# Patient Record
Sex: Male | Born: 1940 | Race: White | Hispanic: No | Marital: Married | Smoking: Never smoker
Health system: Southern US, Community
[De-identification: ages and names within clinical notes are randomized; demographics above are authoritative.]

## PROBLEM LIST (undated history)

## (undated) DIAGNOSIS — T8859XA Other complications of anesthesia, initial encounter: Secondary | ICD-10-CM

## (undated) DIAGNOSIS — E222 Syndrome of inappropriate secretion of antidiuretic hormone: Secondary | ICD-10-CM

## (undated) DIAGNOSIS — R1310 Dysphagia, unspecified: Secondary | ICD-10-CM

## (undated) DIAGNOSIS — K5730 Diverticulosis of large intestine without perforation or abscess without bleeding: Secondary | ICD-10-CM

## (undated) DIAGNOSIS — E039 Hypothyroidism, unspecified: Secondary | ICD-10-CM

## (undated) DIAGNOSIS — R269 Unspecified abnormalities of gait and mobility: Secondary | ICD-10-CM

## (undated) DIAGNOSIS — E079 Disorder of thyroid, unspecified: Secondary | ICD-10-CM

## (undated) DIAGNOSIS — K219 Gastro-esophageal reflux disease without esophagitis: Secondary | ICD-10-CM

## (undated) DIAGNOSIS — M1990 Unspecified osteoarthritis, unspecified site: Secondary | ICD-10-CM

## (undated) DIAGNOSIS — E669 Obesity, unspecified: Secondary | ICD-10-CM

## (undated) DIAGNOSIS — T4145XA Adverse effect of unspecified anesthetic, initial encounter: Secondary | ICD-10-CM

## (undated) DIAGNOSIS — I10 Essential (primary) hypertension: Secondary | ICD-10-CM

## (undated) DIAGNOSIS — M5416 Radiculopathy, lumbar region: Secondary | ICD-10-CM

## (undated) DIAGNOSIS — G629 Polyneuropathy, unspecified: Secondary | ICD-10-CM

## (undated) HISTORY — DX: Adverse effect of unspecified anesthetic, initial encounter: T41.45XA

## (undated) HISTORY — DX: Diverticulosis of large intestine without perforation or abscess without bleeding: K57.30

## (undated) HISTORY — DX: Dysphagia, unspecified: R13.10

## (undated) HISTORY — PX: OTHER SURGICAL HISTORY: SHX169

## (undated) HISTORY — DX: Syndrome of inappropriate secretion of antidiuretic hormone: E22.2

## (undated) HISTORY — DX: Obesity, unspecified: E66.9

## (undated) HISTORY — DX: Gastro-esophageal reflux disease without esophagitis: K21.9

## (undated) HISTORY — DX: Polyneuropathy, unspecified: G62.9

## (undated) HISTORY — DX: Radiculopathy, lumbar region: M54.16

## (undated) HISTORY — DX: Unspecified abnormalities of gait and mobility: R26.9

## (undated) HISTORY — DX: Hypothyroidism, unspecified: E03.9

## (undated) HISTORY — DX: Essential (primary) hypertension: I10

## (undated) HISTORY — DX: Disorder of thyroid, unspecified: E07.9

## (undated) HISTORY — DX: Other complications of anesthesia, initial encounter: T88.59XA

## (undated) HISTORY — DX: Unspecified osteoarthritis, unspecified site: M19.90

---

## 2014-11-29 ENCOUNTER — Other Ambulatory Visit: Payer: Self-pay | Admitting: Orthopaedic Surgery

## 2014-11-29 DIAGNOSIS — M48061 Spinal stenosis, lumbar region without neurogenic claudication: Secondary | ICD-10-CM

## 2014-12-14 ENCOUNTER — Other Ambulatory Visit: Payer: Self-pay

## 2015-01-11 ENCOUNTER — Other Ambulatory Visit: Payer: Self-pay

## 2015-01-11 ENCOUNTER — Inpatient Hospital Stay
Admission: RE | Admit: 2015-01-11 | Discharge: 2015-01-11 | Disposition: A | Discharge status: Home / Self Care | Payer: Self-pay | Source: Ambulatory Visit | Attending: Orthopaedic Surgery | Admitting: Orthopaedic Surgery

## 2015-01-11 NOTE — Discharge Instructions (Signed)

## 2015-01-20 ENCOUNTER — Ambulatory Visit
Admission: RE | Admit: 2015-01-20 | Discharge: 2015-01-20 | Disposition: A | Discharge status: Home / Self Care | Payer: Medicare Other | Source: Ambulatory Visit | Attending: Orthopaedic Surgery | Admitting: Orthopaedic Surgery

## 2015-01-20 ENCOUNTER — Other Ambulatory Visit: Payer: Self-pay | Admitting: Orthopaedic Surgery

## 2015-01-20 ENCOUNTER — Inpatient Hospital Stay
Admission: RE | Admit: 2015-01-20 | Discharge: 2015-01-20 | Disposition: A | Discharge status: Home / Self Care | Payer: Self-pay | Source: Ambulatory Visit | Attending: Orthopaedic Surgery | Admitting: Orthopaedic Surgery

## 2015-01-20 DIAGNOSIS — M48061 Spinal stenosis, lumbar region without neurogenic claudication: Secondary | ICD-10-CM

## 2015-01-20 MED ORDER — IOHEXOL 300 MG/ML  SOLN
10.0000 mL | Freq: Once | INTRAMUSCULAR | Status: DC | PRN
  Administered 2015-01-20: 10 mL via INTRATHECAL

## 2015-01-20 MED ORDER — MEPERIDINE HCL 100 MG/ML IJ SOLN
75.0000 mg | Freq: Once | INTRAMUSCULAR | Status: AC
  Administered 2015-01-20: 75 mg via INTRAMUSCULAR

## 2015-01-20 MED ORDER — ONDANSETRON HCL 4 MG/2ML IJ SOLN
4.0000 mg | Freq: Once | INTRAMUSCULAR | Status: AC
  Administered 2015-01-20: 4 mg via INTRAMUSCULAR

## 2015-01-20 NOTE — Discharge Instructions (Signed)

## 2015-11-13 ENCOUNTER — Other Ambulatory Visit: Payer: Self-pay | Admitting: Orthopaedic Surgery

## 2015-11-13 DIAGNOSIS — M4726 Other spondylosis with radiculopathy, lumbar region: Secondary | ICD-10-CM

## 2015-11-30 ENCOUNTER — Ambulatory Visit
Admission: RE | Admit: 2015-11-30 | Discharge: 2015-11-30 | Disposition: A | Discharge status: Home / Self Care | Payer: Medicare Other | Source: Ambulatory Visit | Attending: Orthopaedic Surgery | Admitting: Orthopaedic Surgery

## 2015-11-30 DIAGNOSIS — M4726 Other spondylosis with radiculopathy, lumbar region: Secondary | ICD-10-CM

## 2017-03-18 IMAGING — MR MR LUMBAR SPINE W/O CM
4 of 5 series · 23 of 48 positions shown · non-contrast
Comparison: CT lumbar myelogram 01/20/2015.

CLINICAL DATA: 75-year-old male with chronic lumbar back and left
and 2954. Spinal injections, most recently 8 months ago. Widespread
lumbar spinal stenosis. Subsequent encounter.

EXAM:
MRI LUMBAR SPINE WITHOUT CONTRAST
TECHNIQUE: Multiplanar, multisequence MR imaging of the lumbar spine was
performed. No intravenous contrast was administered.

[Series 2: T2 · sagittal · 4.0mm · 0.55mm/px · 6 of 13 slices shown (1 of 2)]
[im 1/13]
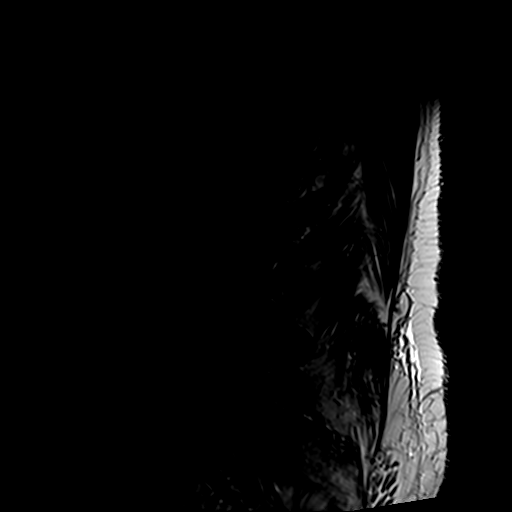
[im 3/13]
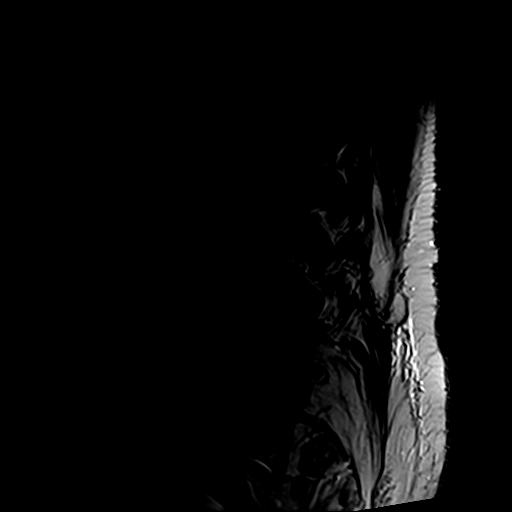
[im 5/13]
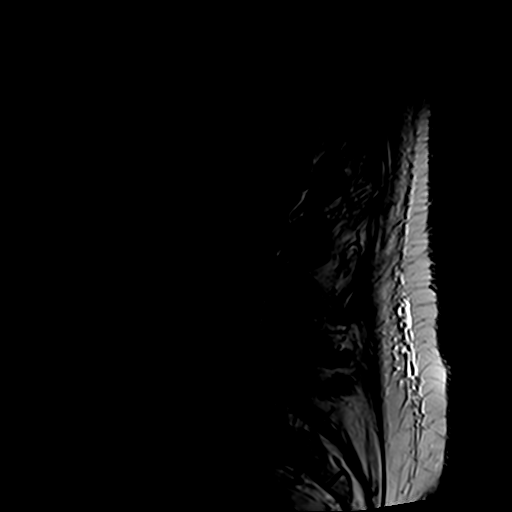
[im 8/13]
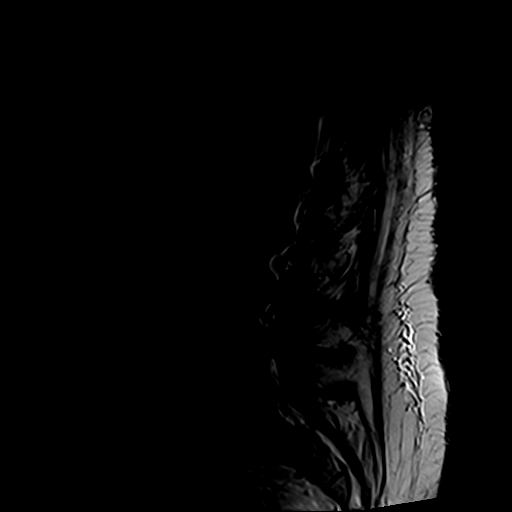
[im 10/13]
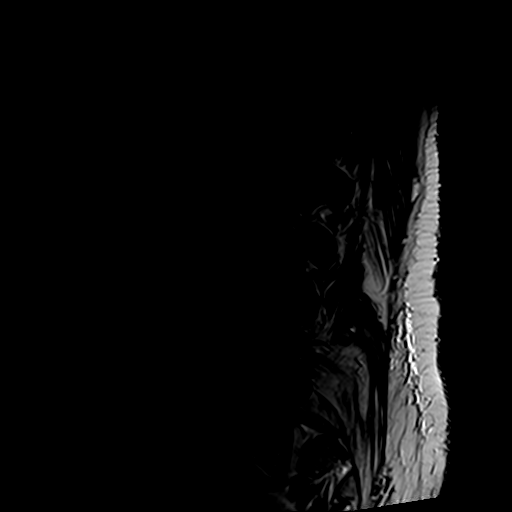
[im 13/13]
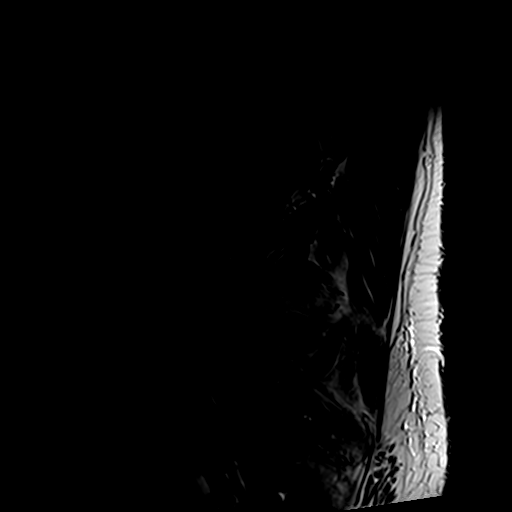

[Series 3: T1 · sagittal · 4.0mm · 0.55mm/px · 5 of 13 slices shown (1 of 2)]
[im 1/13]
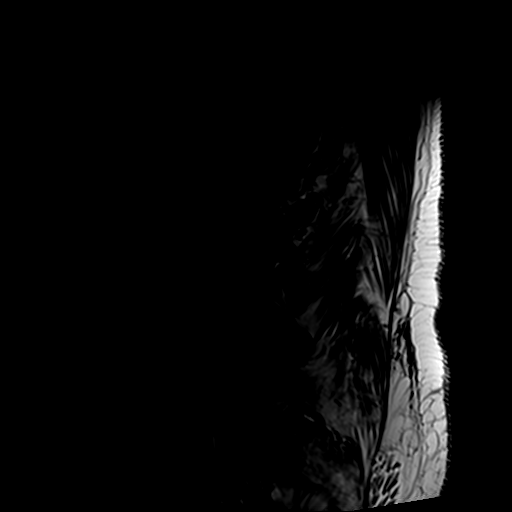
[im 3/13]
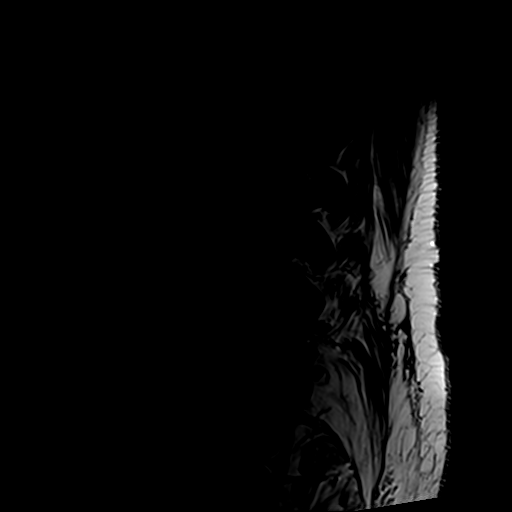
[im 5/13]
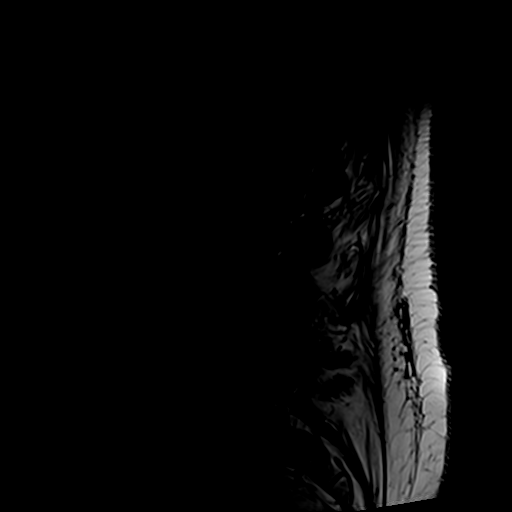
[im 8/13]
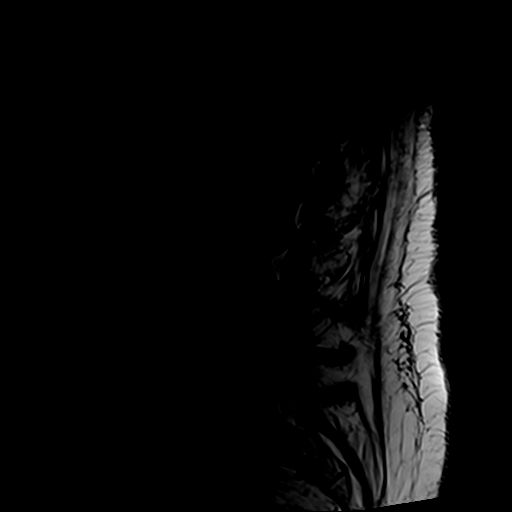
[im 13/13]
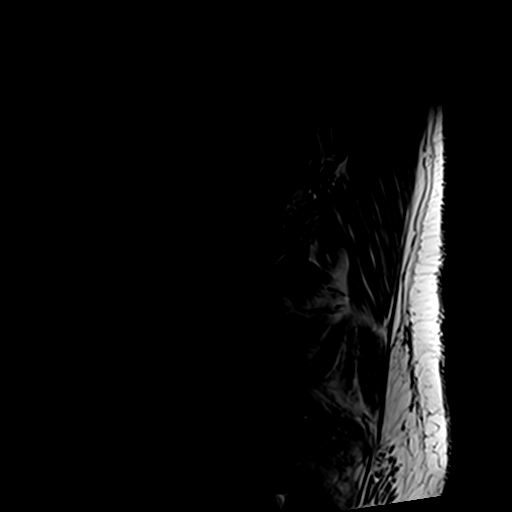

[Series 4: T1 · axial · 4.0mm · 0.31mm/px · z∈[-44,+74]mm · 3 of 31 slices shown (2 of 2)]
[im 5/31]
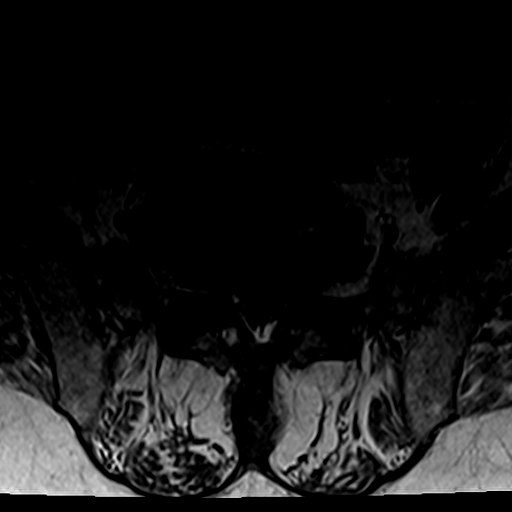
[im 16/31]
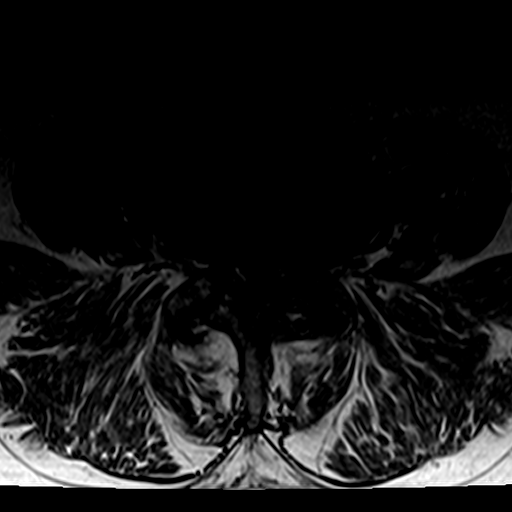
[im 26/31]
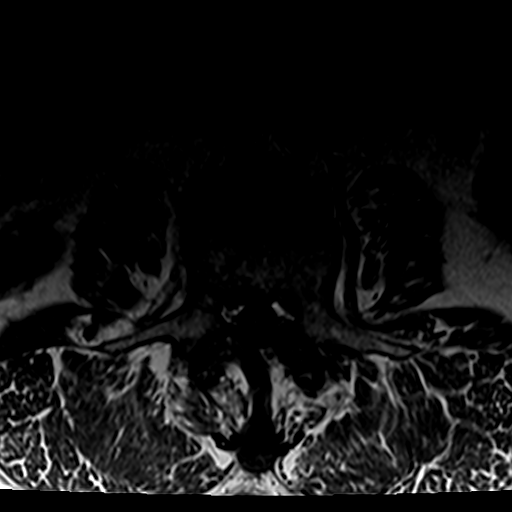

[Series 5: T2 · axial · 4.0mm · 0.61mm/px · z∈[-65,+99]mm · 9 of 31 slices shown (2 of 2)]
[im 1/31]
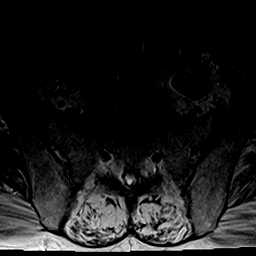
[im 5/31]
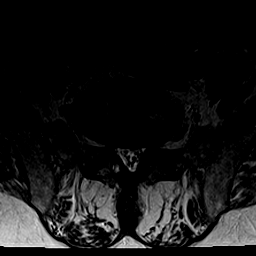
[im 9/31]
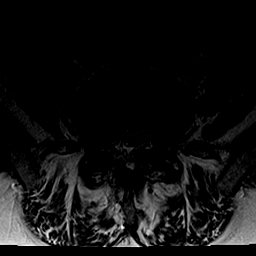
[im 13/31]
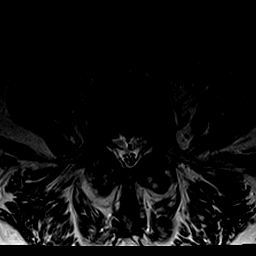
[im 16/31]
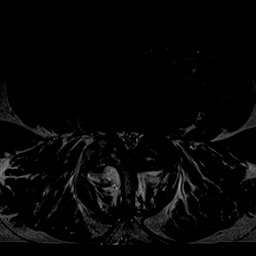
[im 18/31]
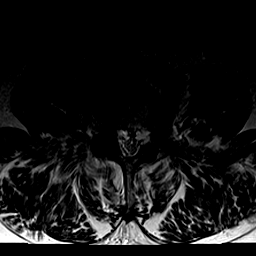
[im 22/31]
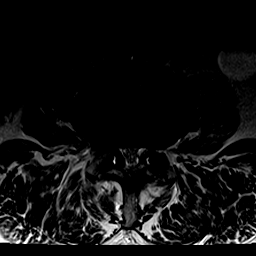
[im 26/31]
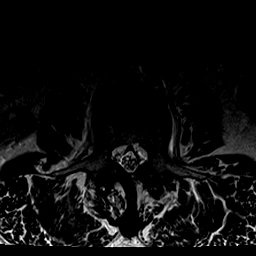
[im 31/31]
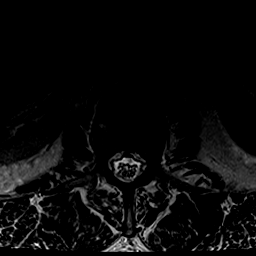

[23 of 48 positions shown; findings below may reference images not displayed]

FINDINGS: Segmentation:  Normal, as seen on the 2722 myelogram.

Alignment: Stable since 2722 with mild retrolisthesis of L5 on S1,
straightening of lumbar lordosis, and mild retrolisthesis also at
L1-L2 and L2-L3.

Vertebrae: Chronic superior endplate Schmorl node at L2. Diffuse
chronic endplate spurring. Trace degenerative appearing endplate
marrow edema laterally on the left at L5-S1. No acute osseous
abnormality identified.

Conus medullaris: Extends to the T12-L1 level and appears normal.

Paraspinal and other soft tissues: Stable. Small benign exophytic
left renal lower pole cyst. Mild tortuosity of the aortoiliac
bifurcation.

Disc levels:

T10-T11: Partially visible disc protrusion and spinal stenosis,
better demonstrated on the thoracic CT myelogram 01/20/2015.

T11-12: Chronic mild disc bulge and mild facet hypertrophy. No
significant stenosis.

T12-L1: Chronic mild disc bulge and facet hypertrophy. No
significant stenosis.

L1-L2: Chronic severe disc space loss with circumferential disc
osteophyte complex. Broad-based posterior component. Moderate facet
hypertrophy. Postoperative changes to the left lamina. Epidural
lipomatosis. Mild to moderate spinal stenosis appears stable. Mild
L1 foraminal stenosis.

L2-L3: Severe disc space loss. Circumferential disc osteophyte
complex with broad-based posterior component. Moderate facet
hypertrophy. Postoperative changes to the left lamina. Epidural
lipomatosis. Moderate spinal stenosis appears stable. Mild to
moderate left and moderate right L2 foraminal stenosis is stable.

L3-L4: Circumferential disc osteophyte complex. Broad-based
posterior component. Moderate to severe facet hypertrophy.
Postoperative changes to the left lamina. Epidural lipomatosis.
Moderate to severe spinal stenosis has not significantly changed.
Moderate to severe left and moderate right L3 foraminal stenosis
also appears stable.

L4-L5: Vacuum disc. Bulky circumferential disc osteophyte complex.
Postoperative changes to the right lamina. Severe residual facet
hypertrophy. Epidural lipomatosis. Moderate to severe spinal
stenosis is stable. Moderate to severe the left greater than right
L4 foraminal stenosis is stable.

L5-S1: Circumferential but mostly far lateral and right greater than
left disc osteophyte complex. Vacuum disc. Moderate facet
hypertrophy. Epidural lipomatosis. Mild to moderate spinal stenosis
is stable. Moderate to severe left and moderate right L5 foraminal
stenosis is stable.
IMPRESSION: 1. Diffuse degenerative lumbar spinal stenosis is stable since the
2722 myelogram, and is related to chronic disc, endplate, and
posterior element degeneration as well as epidural lipomatosis.
2. Moderate to severe spinal stenosis at L3-L4 and L4-L5. Mild to
moderate lumbar spinal stenosis elsewhere.
3. Associated stable moderate or severe lumbar neural foraminal
stenosis at the right L2, bilateral L3, bilateral L4, and bilateral
L5 nerve levels.
4. Partially visible chronic lower thoracic spinal stenosis at
T10-T11.

## 2017-04-03 ENCOUNTER — Encounter: Payer: Self-pay | Admitting: Neurology

## 2017-06-30 ENCOUNTER — Ambulatory Visit: Payer: Medicare Other | Admitting: Neurology

## 2017-08-07 ENCOUNTER — Encounter: Payer: Self-pay | Admitting: Neurology

## 2017-08-07 ENCOUNTER — Ambulatory Visit (INDEPENDENT_AMBULATORY_CARE_PROVIDER_SITE_OTHER): Payer: Medicare Other | Admitting: Neurology

## 2017-08-07 ENCOUNTER — Other Ambulatory Visit: Payer: Medicare Other

## 2017-08-07 VITALS — BP 170/100 | HR 68 | Ht 70.0 in | Wt 250.0 lb

## 2017-08-07 DIAGNOSIS — M21371 Foot drop, right foot: Secondary | ICD-10-CM

## 2017-08-07 DIAGNOSIS — M21372 Foot drop, left foot: Secondary | ICD-10-CM | POA: Diagnosis not present

## 2017-08-07 DIAGNOSIS — G629 Polyneuropathy, unspecified: Secondary | ICD-10-CM | POA: Diagnosis not present

## 2017-08-07 DIAGNOSIS — M5417 Radiculopathy, lumbosacral region: Secondary | ICD-10-CM | POA: Diagnosis not present

## 2017-08-07 DIAGNOSIS — Z9889 Other specified postprocedural states: Secondary | ICD-10-CM

## 2017-08-07 MED ORDER — AMBULATORY NON FORMULARY MEDICATION: 2.0000 [IU] | Freq: Every day | 0 refills | Status: AC

## 2017-08-07 NOTE — Progress Notes (Addendum)
[facility] Neurology Division Clinic Note - Initial Visit   Date: 08/07/17  Mitchell Kerr MRN: [id number] DOB: January 19, 1941   Dear Dr. Leroy Kennedy:  Thank you for your kind referral of Mitchell Kerr for consultation of neuropathy. Although his history is well known to you, please allow [place] to reiterate it for the purpose of our medical record. The patient was accompanied to the clinic by wife who also provides collateral information.     History of Present Illness: Mitchell Kerr is a 77 y.o. right-handed Caucasian male with hypothyroidism, GERD, OA, chronic low back pain s/p prior lumbar surgery (most recent 07/2016) presenting for evaluation of neuropathy.    He first started having problems with low back pain in 2015 and underwent surgery in [place].  Post-operatively, he had an extended stay due to complication (hyponatremia, line related bleeding, etc).  When he was discharged, he noticed problems with balance and was evaluated by Dr. Hyacinth Meeker at [facility] in 2015 - 2016.  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  He was referred to pain management and had ESI for chronic low back pain and takes gabapentin  three times daily and tramadol for pain, which controls his tingling.   Tingling and numbness started in the feet and gradually involved more of his lower legs, now to the mid-calf level.    His most recent lumbar fusion was in May 2018 by Dr. Sharolyn Douglas.  Following his surgery, he noticed left foot drop and over the past few months, similar weakness of the right foot. He used a walker post-operatively for 3 months and then began using a crutch.  He did well for several weeks with physical therapy, however by late 2018, stopped PT because of worsening sciatica. He has fallen several times and tends to trip over his left foot.  He was recommended to wear AFO, but did not like the AFO and returned it.  He did not return to have a new AFO and does not  want to go back to [employer]. He denies any problems with driving.  He was most recently evaluated by Dr. Rosalyn Gess at [facility] for gait difficulty, but choose not to follow-up or proceed with any recommendations which were made.   He drinks about 2-3 glasses of alcohol (scotch and wine) nightly since his 72s.  He is retired from [employer].  No occupational exposure to solvents.  He drinks city water.  He has no personal history of diabetes.    Out-side paper records, electronic medical record, and images have been reviewed where available and summarized as:  Labs 07/16/2017:  Vitamin B12 358, TSH 15*  MRI lumbar spine 12/01/2015: 1. Diffuse degenerative lumbar spinal stenosis is stable since the 2016 myelogram, and is related to chronic disc, endplate, and posterior element degeneration as well as epidural lipomatosis. 2. Moderate to severe spinal stenosis at L3-L4 and L4-L5. Mild to moderate lumbar spinal stenosis elsewhere. 3. Associated stable moderate or severe lumbar neural foraminal stenosis at the right L2, bilateral L3, bilateral L4, and bilateral L5 nerve levels. 4. Partially visible chronic lower thoracic spinal stenosis at T10-T11.  CT thoracic spine w contrast 01/20/2015:  Mild degenerative disc disease. This is most pronounced at T10-11. A right paracentral disc protrusion indents the cord and results in mild central stenosis.  CT lumbar spine w contrast 01/20/2015: Multilevel degenerative changes are present as described. Postoperative changes are also noted There is moderate central stenosis at virtually every level. At L1-2, central  canal is patent but there is narrowing along the sides of the central canal. Moderate bilateral foraminal narrowing at L2-3, L3-4, and L4-5 Clumping of the nerve roots is suspected at L3-4 suggesting arachnoiditis Multilevel facet arthropathy.  Past Medical History:  Diagnosis Date  . Anesthesia complication   . Arthritis   . Chronic lumbar  radiculopathy   . Disease of thyroid gland   . Diverticulosis of colon   . Dysphagia   . Essential hypertension   . Gait abnormality   . GERD (gastroesophageal reflux disease)   . Hypothyroidism   . Laryngopharyngeal reflux (LPR)   . Obesity   . Osteoarthritis   . Peripheral neuropathy   . SIADH (syndrome of inappropriate ADH production) (HCC)     Past Surgical History:  Procedure Laterality Date  . left hip replacement    . left rotator cuff repair    . right hip replacement    . right rotator cuff repair    Lumbar surgery x 3   Medications:  Outpatient Encounter Medications as of 08/07/2017  Medication Sig  . aspirin EC 81 MG tablet Take by mouth.  . cyanocobalamin (,VITAMIN B-12,) 1000 MCG/ML injection Inject into the muscle.  . docusate sodium (COLACE) 100 MG capsule Take by mouth.  . finasteride (PROSCAR) 5 MG tablet Take 1 by mouth daily  . gabapentin (NEURONTIN) 300 MG capsule Take by mouth.  . lansoprazole (PREVACID) 30 MG capsule Take by mouth.  . levothyroxine (SYNTHROID, LEVOTHROID) 150 MCG tablet Take by mouth.  . metoprolol (TOPROL-XL) 200 MG 24 hr tablet   . naproxen sodium (ALEVE) 220 MG tablet Take by mouth.  . terazosin (HYTRIN) 10 MG capsule Take 1 capsule by mouth daily  . traMADol (ULTRAM) 50 MG tablet Take by mouth.  . AMBULATORY NON FORMULARY MEDICATION 2 Units by Other route daily. Bilateral AFOs   No facility-administered encounter medications on file as of 08/07/2017.      Allergies:  Allergies  Allergen Reactions  . Valium [Diazepam] Other (See Comments)    Caused dizziness  . Oxycodone Palpitations    Family History: Father had history of cardiac arrhythmia and has PPM and developed neuropathy in his 46s.  Mother had dementia.  Both parents died at the age of 2. Sister is healthy.  They have one biological child who is healthy. They also have two adopted children.  Social History: Former smoker.   He drinks 2-3 alcoholic drinks  (wine, scotch) nightly since his 24s.   Review of Systems:  CONSTITUTIONAL: No fevers, chills, night sweats, or weight loss.   EYES: No visual changes or eye pain ENT: No hearing changes.  No history of nose bleeds.   RESPIRATORY: No cough, wheezing and shortness of breath.   CARDIOVASCULAR: Negative for chest pain, and palpitations.   GI: Negative for abdominal discomfort, blood in stools or black stools.  No recent change in bowel habits.   GU:  No history of incontinence.   MUSCLOSKELETAL: +history of joint pain or swelling.  No myalgias.   SKIN: Negative for lesions, rash, and itching.   HEMATOLOGY/ONCOLOGY: Negative for prolonged bleeding, bruising easily, and swollen nodes.  No history of cancer.   ENDOCRINE: Negative for cold or heat intolerance, polydipsia or goiter.   PSYCH:  No depression or anxiety symptoms.   NEURO: As Above.   Vital Signs:  BP (!) 170/100   Pulse 68   Ht  (1.778 m)   Wt 250 lb (113.4 kg)  SpO2 94%   BMI 35.87 kg/m    General Medical Exam:   General:  Well appearing, comfortable.   Eyes/ENT: see cranial nerve examination.   Neck: No masses appreciated.  Full range of motion without tenderness.  No carotid bruits. Respiratory:  Clear to auscultation, good air entry bilaterally.   Cardiac:  Regular rate and rhythm, no murmur.   Extremities:  Bilateral foot drop, worse on the left. No edema, or skin discoloration.  Skin:  No rashes or lesions.  Neurological Exam: MENTAL STATUS including orientation to time, place, person, recent and remote memory, attention span and concentration, language, and fund of knowledge is normal.  Speech is not dysarthric.  CRANIAL NERVES: II:  No visual field defects.  Unremarkable fundi.   III-IV-VI: Pupils equal round and reactive to light.  Normal conjugate, extra-ocular eye movements in all directions of gaze.  No nystagmus.  Moderate left ptosis.   V:  Normal facial sensation.     VII:  Normal facial  symmetry and movements.  VIII:  Normal hearing and vestibular function.   IX-X:  Normal palatal movement.   XI:  Normal shoulder shrug and head rotation.   XII:  Normal tongue strength and range of motion, no deviation or fasciculation.  MOTOR:  No atrophy, fasciculations or abnormal movements.  No pronator drift.  Tone is normal.    Right Upper Extremity:    Left Upper Extremity:    Deltoid  5/5   Deltoid  5/5   Biceps  5/5   Biceps  5/5   Triceps  5/5   Triceps  5/5   Wrist extensors  5/5   Wrist extensors  5/5   Wrist flexors  5/5   Wrist flexors  5/5   Finger extensors  5/5   Finger extensors  5/5   Finger flexors  5/5   Finger flexors  5/5   Dorsal interossei  5/5   Dorsal interossei  5/5   Abductor pollicis  5/5   Abductor pollicis  5/5   Tone (Ashworth scale)  0  Tone (Ashworth scale)  0   Right Lower Extremity:    Left Lower Extremity:    Hip flexors  5/5   Hip flexors  5/5   Hip extensors  5/5   Hip extensors  5/5   Knee flexors  5/5   Knee flexors  5/5   Knee extensors  5/5   Knee extensors  5/5   Dorsiflexors  3+/5   Dorsiflexors  1/5   Plantarflexors  4/5   Plantarflexors  4/5   Toe extensors  2/5   Toe extensors  2/5   Toe flexors  2/5   Toe flexors  2/5   Tone (Ashworth scale)  0  Tone (Ashworth scale)  0   MSRs:  Right                                                                 Left brachioradialis 2+  brachioradialis 2+  biceps 2+  biceps 2+  triceps 2+  triceps 2+  patellar 3+  Patellar 3+  ankle jerk 0  ankle jerk 0  Hoffman no  Hoffman no  plantar response down  plantar response down   SENSORY:  Vibration is reduced  to 25% at the right knee, 50% at the left knee; 25% distal to ankles bilaterally.  Temperature, proprioception, and pin prick is intact.   COORDINATION/GAIT: Normal finger-to- nose-finger.  Intact rapid alternating movements bilaterally.  He walks with bilateral high steppage gait, worse on the left, due to bilateral foot drop and appears  unsteady.  He uses a single crutch for support.  IMPRESSION: 1. 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   2. Superimposed severe degenerative changes with chronic multilevel lumbosacral radiculopathies and canal stenosis s/p lumbar surgery  3. Bilateral foot drop and gait difficulty, due to combination of neuropathy and L5 radiculopathy.    4. Elevated blood pressure, asymptomatic.  Monitor at home and f/u with PCP.   PLAN/RECOMMENDATIONS:  Check vitamin B1, MMA, copper, folate, SPEP with IFE NCS/EMG of the legs to see if this can help localize his foot drop; it was explained that with coexisting lumbar radiculopathy and neuropathy, the results may be difficult to tease apart the cause of his foot drop.  He will discuss with Dr. Noel Gerold next week about repeating MRI lumbar spine to assess for worsening L5 nerve impingement.  Unfortunately, treatment options are severely limited and he has not benefited with PT.  I have urged him to wear AFOs and given him a new prescroption which he will take to [employer].  Fall precautions were also discussed.   Encouraged to reduce, if not quit alcohol consumption  Further recommendations pending results   Thank you for allowing me to participate in patient's care.  If I can answer any additional questions, I would be pleased to do so.    Sincerely,    Shaheen Star K. Allena Katz, DO

## 2017-08-07 NOTE — Patient Instructions (Addendum)
Check labs  NCS/EMG of the legs  Please go to [facility] to get fit for bilateral AFOs  Please call my office after you have talked to Dr. Noel Gerold about getting a MRI lumbar spine

## 2017-08-12 ENCOUNTER — Ambulatory Visit (INDEPENDENT_AMBULATORY_CARE_PROVIDER_SITE_OTHER): Payer: Medicare Other | Admitting: Neurology

## 2017-08-12 DIAGNOSIS — M5417 Radiculopathy, lumbosacral region: Secondary | ICD-10-CM

## 2017-08-12 DIAGNOSIS — M21372 Foot drop, left foot: Secondary | ICD-10-CM | POA: Diagnosis not present

## 2017-08-12 DIAGNOSIS — M21371 Foot drop, right foot: Secondary | ICD-10-CM

## 2017-08-12 DIAGNOSIS — Z9889 Other specified postprocedural states: Secondary | ICD-10-CM

## 2017-08-12 DIAGNOSIS — G629 Polyneuropathy, unspecified: Secondary | ICD-10-CM

## 2017-08-12 NOTE — Procedures (Signed)
[facility]  [address] [place], [address]  [place], [place] [address] Tel: [phone] Fax:  [phone] Test Date:  08/12/2017  Patient: Mitchell Kerr DOB: 1940/10/25 Physician: Nita Sickle, DO  Sex: Male Height:  Ref Phys: Nita Sickle, DO  ID#: [id number] Temp: 32.0C Technician:    Patient Complaints: This is a 77 year-old man with history of prior lumbar surgery and neuropathy referred for evaluation of bilateral foot drop.  NCV & EMG Findings: Extensive electrodiagnostic testing of the right lower extremity and additional studies of the left shows:  1. Bilateral sural and superficial peroneal sensory responses are absent. 2. Bilateral tibial and peroneal (EDB) motor responses are absent. Bilateral peroneal motor responses at the tibialis anterior showed reduced amplitude. 3. Bilateral tibial H reflex studies are absent. 4. Chronic motor axon loss changes are seen affecting nearly all the tested muscles of the lower extremities, which is severe below the knees bilaterally and with evidence of active denervation.  Impression: 1. 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 2. A superimposed multilevel radiculopathy affecting L3-L5 myotomes cannot be excluded.   ___________________________ Nita Sickle, DO    Nerve Conduction Studies Anti Sensory Summary Table   Stim Site NR Peak (ms) Norm Peak (ms) P-T Amp (V) Norm P-T Amp  Left Sup Peroneal Anti Sensory (Ant Lat Mall)  12 cm NR  <4.6  >3  Right Sup Peroneal Anti Sensory (Ant Lat Mall)  12 cm NR  <4.6  >3  Left Sural Anti Sensory (Lat Mall)  Calf NR  <4.6  >3  Right Sural Anti Sensory (Lat Mall)  Calf NR  <4.6  >3   Motor Summary Table   Stim Site NR Onset (ms) Norm Onset (ms) O-P Amp (mV) Norm O-P Amp Site1 Site2 Delta-0 (ms) Dist (cm) Vel (m/s) Norm  Vel (m/s)  Left Peroneal Motor (Ext Dig Brev)  Ankle NR  <6.0  >2.5 B Fib Ankle  0.0  >40  B Fib NR     Poplt B Fib  0.0  >40  Poplt NR            Right Peroneal Motor (Ext Dig Brev)  Ankle NR  <6.0  >2.5 B Fib Ankle  0.0  >40  B Fib NR     Poplt B Fib  0.0  >40  Poplt NR            Left Peroneal TA Motor (Tib Ant)  Fib Head    3.8 <4.5 1.3 >3 Poplit Fib Head 1.4 9.0 64 >40  Poplit    5.2  0.9         Right Peroneal TA Motor (Tib Ant)  Fib Head    4.2 <4.5 1.6 >3 Poplit Fib Head 1.7 10.0 59 >40  Poplit    5.9  1.4         Left Tibial Motor (Abd Hall Brev)  Ankle NR  <6.0  >4 Knee Ankle  0.0  >40  Knee NR            Right Tibial Motor (Abd Hall Brev)  Ankle NR  <6.0  >4 Knee Ankle  0.0  >40  Knee NR             H Reflex Studies   NR H-Lat (ms) Lat Norm (ms) L-R H-Lat (ms)  Left Tibial (Gastroc)  NR  <35  Right Tibial (Gastroc)  NR  <35    EMG   Side Muscle Ins Act Fibs Psw Fasc Number Recrt Dur Dur. Amp Amp. Poly Poly. Comment  Left AntTibialis Nml 2+ Nml Nml SMU Rapid All 1+ All 1+ All 1+ N/A  Left Gastroc Nml 1+ Nml Nml 2- Rapid Many 1+ Many 1+ Some 1+ N/A  Left Flex Dig Long Nml 1+ Nml Nml SMU Rapid Many 1+ Many 1+ Nml Nml N/A  Left RectFemoris Nml Nml Nml Nml 2- Rapid Some 1+ Some 1+ Some 1+ N/A  Left GluteusMed Nml Nml Nml Nml 1- Rapid Few 1+ Few 1+ Nml Nml N/A  Right AntTibialis Nml 2+ Nml Nml SMU Rapid Many 1+ Many 1+ Some 1+ N/A  Right Gastroc Nml 1+ Nml Nml 2- Rapid Many 1+ Many 1+ Nml Nml N/A  Right Flex Dig Long Nml 1+ Nml Nml SMU Rapid Many 1+ Many 1+ Nml Nml N/A  Right RectFemoris Nml Nml Nml Nml 1- Rapid Some 1+ Some 1+ Nml Nml N/A  Right GluteusMed Nml Nml Nml Nml 1- Rapid Few 1+ Few 1+ Nml Nml N/A  Left BicepsFemS Nml Nml Nml Nml Nml Nml Nml Nml Nml Nml Nml Nml N/A  Right BicepsFemS Nml Nml Nml Nml Nml Nml Nml Nml Nml Nml Nml Nml N/A      Waveforms:

## 2017-08-13 ENCOUNTER — Telehealth: Payer: Self-pay | Admitting: *Deleted

## 2017-08-13 NOTE — Telephone Encounter (Signed)
Patient given results.  He has appointment next week with Dr. Noel Gerold.  EMG report faxed.

## 2017-08-13 NOTE — Telephone Encounter (Signed)
-----   Message from Glendale Chard, DO sent at 08/13/2017 10:41 AM EDT ----- Please inform patient that his nerve testing shows that his neuropathy in the lower legs is worse and his weakness is less likely to be due to nerve impingement in the back.  He can still discuss getting a MRI with his back surgeon, but from the testing results, looks like the neuropathy is causing his foot drop.

## 2017-08-18 ENCOUNTER — Other Ambulatory Visit: Payer: Self-pay | Admitting: Neurology

## 2017-08-21 ENCOUNTER — Telehealth: Payer: Self-pay | Admitting: Neurology

## 2017-08-21 NOTE — Telephone Encounter (Signed)
Pt called and wanted to let Dr Allena KatzPatel know he will be having his MRI in about a week

## 2017-08-22 ENCOUNTER — Other Ambulatory Visit: Payer: Self-pay | Admitting: Orthopaedic Surgery

## 2017-08-22 DIAGNOSIS — M4326 Fusion of spine, lumbar region: Secondary | ICD-10-CM

## 2017-08-22 NOTE — Telephone Encounter (Signed)
FYI

## 2017-08-23 LAB — PROTEIN ELECTROPHORESIS, SERUM
ALBUMIN ELP: 4.1 g/dL (ref 3.8–4.8)
ALPHA 1: 0.3 g/dL (ref 0.2–0.3)
ALPHA 2: 0.5 g/dL (ref 0.5–0.9)
Beta 2: 0.4 g/dL (ref 0.2–0.5)
Beta Globulin: 0.4 g/dL (ref 0.4–0.6)
Gamma Globulin: 1.2 g/dL (ref 0.8–1.7)
TOTAL PROTEIN: 7 g/dL (ref 6.1–8.1)

## 2017-08-23 LAB — IMMUNOFIXATION ELECTROPHORESIS
IGG (IMMUNOGLOBIN G), SERUM: 1243 mg/dL (ref 694–1618)
IGM, SERUM: 77 mg/dL (ref 48–271)
IMMUNOGLOBULIN A: 320 mg/dL (ref 81–463)
Immunofix Electr Int: NOT DETECTED

## 2017-08-23 LAB — FOLATE RBC: RBC FOLATE: 1017 ng/mL (ref >280)

## 2017-08-23 LAB — VITAMIN B1: Vitamin B1 (Thiamine): 25 nmol/L (ref 8–30)

## 2017-08-23 LAB — COPPER, SERUM: COPPER: 76 ug/dL (ref 70–175)

## 2017-08-23 LAB — METHYLMALONIC ACID, SERUM: Methylmalonic Acid, Quant: 125 nmol/L (ref 87–318)

## 2017-09-22 ENCOUNTER — Ambulatory Visit
Admission: RE | Admit: 2017-09-22 | Discharge: 2017-09-22 | Disposition: A | Discharge status: Home / Self Care | Payer: Medicare Other | Source: Ambulatory Visit | Attending: Orthopaedic Surgery | Admitting: Orthopaedic Surgery

## 2017-09-22 DIAGNOSIS — M4326 Fusion of spine, lumbar region: Secondary | ICD-10-CM

## 2017-10-27 ENCOUNTER — Ambulatory Visit: Payer: Medicare Other | Admitting: Neurology

## 2017-10-27 ENCOUNTER — Encounter

## 2019-10-25 ENCOUNTER — Ambulatory Visit (INDEPENDENT_AMBULATORY_CARE_PROVIDER_SITE_OTHER): Payer: Medicare Other | Admitting: Neurology

## 2019-10-25 ENCOUNTER — Other Ambulatory Visit: Payer: Self-pay

## 2019-10-25 ENCOUNTER — Encounter: Payer: Self-pay | Admitting: Neurology

## 2019-10-25 VITALS — BP 158/89 | HR 75 | Resp 20 | Ht 69.5 in | Wt 243.0 lb

## 2019-10-25 DIAGNOSIS — M4307 Spondylolysis, lumbosacral region: Secondary | ICD-10-CM

## 2019-10-25 DIAGNOSIS — M21371 Foot drop, right foot: Secondary | ICD-10-CM

## 2019-10-25 DIAGNOSIS — G629 Polyneuropathy, unspecified: Secondary | ICD-10-CM | POA: Diagnosis not present

## 2019-10-25 DIAGNOSIS — M21372 Foot drop, left foot: Secondary | ICD-10-CM

## 2019-10-25 DIAGNOSIS — G8929 Other chronic pain: Secondary | ICD-10-CM

## 2019-10-25 DIAGNOSIS — M25561 Pain in right knee: Secondary | ICD-10-CM | POA: Diagnosis not present

## 2019-10-25 DIAGNOSIS — M25562 Pain in left knee: Secondary | ICD-10-CM

## 2019-10-25 NOTE — Progress Notes (Signed)
Follow-up Visit   Date: 10/25/19   Mitchell Kerr MRN: [id number] DOB: [birth year]   Interim History: Mitchell Kerr is a 79 y.o. right-handed Caucasian male with hypothyroidism, GERD, OA, chronic low back pain s/p lumbar surgery returning to the clinic with new complaints of bilateral knee pain.  The patient was accompanied to the clinic by wife who also provides collateral information.   He was evaluated here in May 2019 for neuropathy and bilateral foot drop.  He suffered a fall which required left hip revision in February 2021, which has taking longer to recover from. He has been walking with a walker.  During this time, he has developed bilateral knee pain, sharp on the left side and dull in the back of the knee on the right.  His knee pain is worse when standing or walking and relieve with rest.  There is no numbness/tingling in the knee.  He has been favoring his right side when walking because of his hip pain.  He was referred back to see me to evaluate whether his knee pain could be stemming from neuropathy.   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    Medications:  Current Outpatient Medications on File Prior to Visit  Medication Sig Dispense Refill  . amLODipine (NORVASC) 5 MG tablet TAKE 1 TABLET(5 MG) BY MOUTH EVERY NIGHT    . AMBULATORY NON FORMULARY MEDICATION 2 Units by Other route daily. Bilateral AFOs 2 Units 0  . aspirin EC 81 MG tablet Take by mouth.    . cyanocobalamin (,VITAMIN B-12,) 1000 MCG/ML injection Inject into the muscle.    . docusate sodium (COLACE) 100 MG capsule Take by mouth.    . finasteride (PROSCAR) 5 MG tablet Take 1 by mouth daily    . gabapentin (NEURONTIN) 300 MG capsule Take by mouth.    . lansoprazole (PREVACID) 30 MG capsule Take by mouth.    . levothyroxine (SYNTHROID, LEVOTHROID) 150 MCG tablet Take by mouth.    . metoprolol (TOPROL-XL) 200 MG 24 hr tablet   1  .  naproxen sodium (ALEVE) 220 MG tablet Take by mouth.    . terazosin (HYTRIN) 10 MG capsule Take 1 capsule by mouth daily    . traMADol (ULTRAM) 50 MG tablet Take by mouth.     No current facility-administered medications on file prior to visit.    Allergies:  Allergies  Allergen Reactions  . Valium [Diazepam] Other (See Comments)    Caused dizziness  . Oxycodone Palpitations    Vital Signs:  BP (!) 158/89   Pulse 75   Resp 20   Ht 5' 9.5" (1.765 m)   Wt 243 lb (110.2 kg)   SpO2 95%   BMI 35.37 kg/m   Neurological Exam: MENTAL STATUS including orientation to time, place, person, recent and remote memory, attention span and concentration, language, and fund of knowledge is normal.  Speech is not dysarthric.  CRANIAL NERVES:  No visual field defects.  Pupils equal round and reactive.  Normal conjugate, extra-ocular eye movements in all directions of gaze.  No ptosis.  Face is symmetric.   MOTOR:  No atrophy, fasciculations or abnormal movements.  No pronator drift.   Upper Extremity:  Right  Left  Deltoid  5/5   5/5   Biceps  5/5   5/5   Triceps  5/5   5/5   Infraspinatus 5/5  5/5  Medial pectoralis 5/5  5/5  Wrist extensors  5/5   5/5   Wrist flexors  5/5   5/5   Finger extensors  5/5   5/5   Finger flexors  5/5   5/5   Dorsal interossei  5/5   5/5   Abductor pollicis  5/5   5/5   Tone (Ashworth scale)  0  0   Lower Extremity:  Right  Left  Hip flexors  5/5   5/5   Hip extensors  5/5   5/5   Adductor 5/5  5/5  Abductor 5/5  5/5  Knee flexors  5/5   5/5   Knee extensors  5/5   5/5   Dorsiflexors  3/5   1/5   Plantarflexors  4/5   4/5   Toe extensors  2/5   1/5   Toe flexors  2/5   2/5   Tone (Ashworth scale)  0  0   MSRs:  Reflexes are 2+/4 throughout and absent at the ankles.  SENSORY:  Vibration absent distal to ankles, intact at the knees. Temperature and pin prick reduced in a gradient pattern distal to mid-calf bilaterally. Rhomberg sign positive.  Marland Kitchen  COORDINATION/GAIT:  Normal finger-to- nose-finger.  Intact rapid alternating movements bilaterally. Gait is wide-based, assisted with walker, bilateral foot drop, worse on the left  Data: NCS/EMG bilateral legs 08/12/2017: 1. 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 2. A superimposed multilevel radiculopathy affecting L3-L5 myotomes cannot be excluded.  MRI lumbar spine wo contrast 12/01/2015: 1. Diffuse degenerative lumbar spinal stenosis is stable since the 2016 myelogram, and is related to chronic disc, endplate, and posterior element degeneration as well as epidural lipomatosis. 2. Moderate to severe spinal stenosis at L3-L4 and L4-L5. Mild to moderate lumbar spinal stenosis elsewhere. 3. Associated stable moderate or severe lumbar neural foraminal stenosis at the right L2, bilateral L3, bilateral L4, and bilateral L5 nerve levels. 4. Partially visible chronic lower thoracic spinal stenosis at T10-T11.  IMPRESSION/PLAN: 1. Bilateral knee pain is not stemming from underlying neuropathy or lumbar spondylosis.  His pain is more characteristic of arthritic/ligamentous in nature and probably due to altered mechanics from compensating for bilateral foot drop and his left hip pain.  He will follow-up with orthopaedic surgery.   2. He has known peripheral neuropathy and lumbosacral spondylosis contributing to both sensory loss and bilateral foot drop.  Unfortunately, there is no treatment for either and management is supportive.  Continue to wear braces and use walker for gait assistance.    Total time spent reviewing records, interview, history/exam, documentation, counseling, and coordination of care on day of encounter:  45 min  Thank you for allowing me to participate in patient's care.  If I can answer any additional questions, I would be  pleased to do so.    Sincerely,    Reene Harlacher K. Allena Katz, DO

## 2019-10-25 NOTE — Patient Instructions (Signed)
Please follow-up with your orthopaedic specialist for knee pain

## 2019-12-31 ENCOUNTER — Ambulatory Visit: Payer: Medicare Other | Admitting: Neurology

## 2020-12-16 DEATH — deceased
